# Patient Record
Sex: Male | Born: 2000 | Race: Black or African American | Hispanic: No | Marital: Single | Smoking: Never smoker
Health system: Southern US, Community
[De-identification: ages and names within clinical notes are randomized; demographics above are authoritative.]

## PROBLEM LIST (undated history)

## (undated) DIAGNOSIS — F909 Attention-deficit hyperactivity disorder, unspecified type: Secondary | ICD-10-CM

## (undated) DIAGNOSIS — T7840XA Allergy, unspecified, initial encounter: Secondary | ICD-10-CM

## (undated) DIAGNOSIS — F419 Anxiety disorder, unspecified: Secondary | ICD-10-CM

## (undated) DIAGNOSIS — L309 Dermatitis, unspecified: Secondary | ICD-10-CM

## (undated) HISTORY — DX: Allergy, unspecified, initial encounter: T78.40XA

## (undated) HISTORY — DX: Anxiety disorder, unspecified: F41.9

## (undated) HISTORY — DX: Dermatitis, unspecified: L30.9

## (undated) HISTORY — DX: Attention-deficit hyperactivity disorder, unspecified type: F90.9

---
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
## 2020-02-16 ENCOUNTER — Ambulatory Visit: Payer: Commercial Managed Care - PPO | Attending: Family

## 2020-02-16 DIAGNOSIS — Z23 Encounter for immunization: Secondary | ICD-10-CM

## 2020-02-16 NOTE — Progress Notes (Signed)
   Covid-19 Vaccination Clinic  Name:  Kenneth Miranda    MRN: [id number] DOB: 06-May-2001  02/16/2020  [name] was observed post Covid-19 immunization for 15 minutes without incident. He was provided with Vaccine Information Sheet and instruction to access the V-Safe system.   [name] was instructed to call 911 with any severe reactions post vaccine: Marland Kitchen Difficulty breathing  . Swelling of face and throat  . A fast heartbeat  . A bad rash all over body  . Dizziness and weakness   Immunizations Administered    Name Date Dose VIS Date Route   Moderna COVID-19 Vaccine 02/16/2020  1:37 PM 0.5 mL 10/11/2019 Intramuscular   Manufacturer: Moderna   Lot: [id number]   NDC: 77034-035-24

## 2020-03-13 ENCOUNTER — Ambulatory Visit: Payer: Commercial Managed Care - PPO | Attending: Internal Medicine

## 2020-03-22 ENCOUNTER — Ambulatory Visit: Payer: Commercial Managed Care - PPO | Attending: Family

## 2020-03-22 DIAGNOSIS — Z23 Encounter for immunization: Secondary | ICD-10-CM

## 2020-03-22 NOTE — Progress Notes (Signed)
   Covid-19 Vaccination Clinic  Name:  Kenneth Miranda    MRN: [id number] DOB: [birth year]  03/22/2020  Kenneth Miranda was observed post Covid-19 immunization for 15 minutes without incident. He was provided with Vaccine Information Sheet and instruction to access the V-Safe system.   Kenneth Miranda was instructed to call 911 with any severe reactions post vaccine: Marland Kitchen Difficulty breathing  . Swelling of face and throat  . A fast heartbeat  . A bad rash all over body  . Dizziness and weakness   Immunizations Administered    Name Date Dose VIS Date Route   Moderna COVID-19 Vaccine 03/22/2020  1:53 PM 0.5 mL 10/2019 Intramuscular   Manufacturer: Moderna   Lot: [id number]   NDC: 77034-035-24
